# Patient Record
Sex: Female | Born: 1995 | Race: Black or African American | Hispanic: No | Marital: Single | Smoking: Never smoker
Health system: Southern US, Community
[De-identification: ages and names within clinical notes are randomized; demographics above are authoritative.]

## PROBLEM LIST (undated history)

## (undated) DIAGNOSIS — F419 Anxiety disorder, unspecified: Secondary | ICD-10-CM

## (undated) DIAGNOSIS — F329 Major depressive disorder, single episode, unspecified: Secondary | ICD-10-CM

## (undated) DIAGNOSIS — F32A Depression, unspecified: Secondary | ICD-10-CM

## (undated) DIAGNOSIS — Z8719 Personal history of other diseases of the digestive system: Secondary | ICD-10-CM

## (undated) HISTORY — DX: Personal history of other diseases of the digestive system: Z87.19

## (undated) HISTORY — DX: Anxiety disorder, unspecified: F41.9

## (undated) HISTORY — DX: Depression, unspecified: F32.A

## (undated) HISTORY — DX: Major depressive disorder, single episode, unspecified: F32.9

---

## 2016-01-25 ENCOUNTER — Ambulatory Visit: Payer: Self-pay | Admitting: Obstetrics & Gynecology

## 2016-03-21 ENCOUNTER — Other Ambulatory Visit: Payer: Self-pay | Admitting: Nurse Practitioner

## 2016-03-21 DIAGNOSIS — N926 Irregular menstruation, unspecified: Secondary | ICD-10-CM

## 2016-03-28 ENCOUNTER — Ambulatory Visit
Admission: RE | Admit: 2016-03-28 | Discharge: 2016-03-28 | Disposition: A | Discharge status: Home / Self Care | Payer: BLUE CROSS/BLUE SHIELD | Source: Ambulatory Visit | Attending: Nurse Practitioner | Admitting: Nurse Practitioner

## 2016-03-28 ENCOUNTER — Ambulatory Visit
Admission: RE | Admit: 2016-03-28 | Discharge: 2016-03-28 | Disposition: A | Discharge status: Home / Self Care | Payer: Self-pay | Source: Ambulatory Visit | Attending: Nurse Practitioner | Admitting: Nurse Practitioner

## 2016-03-28 DIAGNOSIS — N926 Irregular menstruation, unspecified: Secondary | ICD-10-CM

## 2016-04-01 ENCOUNTER — Other Ambulatory Visit: Payer: Self-pay

## 2017-09-12 ENCOUNTER — Encounter: Payer: Self-pay | Admitting: Gastroenterology

## 2017-10-23 ENCOUNTER — Encounter (INDEPENDENT_AMBULATORY_CARE_PROVIDER_SITE_OTHER): Payer: Self-pay

## 2017-10-23 ENCOUNTER — Encounter: Payer: Self-pay | Admitting: Gastroenterology

## 2017-10-23 ENCOUNTER — Ambulatory Visit: Payer: BLUE CROSS/BLUE SHIELD | Admitting: Gastroenterology

## 2017-10-23 ENCOUNTER — Other Ambulatory Visit (INDEPENDENT_AMBULATORY_CARE_PROVIDER_SITE_OTHER): Payer: BLUE CROSS/BLUE SHIELD

## 2017-10-23 VITALS — BP 128/80 | HR 88 | Ht 63.0 in | Wt 203.8 lb

## 2017-10-23 DIAGNOSIS — R194 Change in bowel habit: Secondary | ICD-10-CM | POA: Diagnosis not present

## 2017-10-23 DIAGNOSIS — R195 Other fecal abnormalities: Secondary | ICD-10-CM

## 2017-10-23 LAB — CBC WITH DIFFERENTIAL/PLATELET
Basophils Absolute: 0 10*3/uL (ref 0.0–0.1)
Basophils Relative: 0.3 % (ref 0.0–3.0)
EOS PCT: 0.3 % (ref 0.0–5.0)
Eosinophils Absolute: 0 10*3/uL (ref 0.0–0.7)
HEMATOCRIT: 38.6 % (ref 36.0–46.0)
Hemoglobin: 13.1 g/dL (ref 12.0–15.0)
LYMPHS ABS: 2.1 10*3/uL (ref 0.7–4.0)
LYMPHS PCT: 51.7 % — AB (ref 12.0–46.0)
MCHC: 34 g/dL (ref 30.0–36.0)
MCV: 85.4 fl (ref 78.0–100.0)
MONOS PCT: 4 % (ref 3.0–12.0)
Monocytes Absolute: 0.2 10*3/uL (ref 0.1–1.0)
Neutro Abs: 1.8 10*3/uL (ref 1.4–7.7)
Neutrophils Relative %: 43.7 % (ref 43.0–77.0)
Platelets: 228 10*3/uL (ref 150.0–400.0)
RBC: 4.52 Mil/uL (ref 3.87–5.11)
RDW: 12.7 % (ref 11.5–15.5)
WBC: 4 10*3/uL (ref 4.0–10.5)

## 2017-10-23 NOTE — Progress Notes (Signed)
Referring Provider: No ref. 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 Will obtain records from prior evaluation. Focus on symptom management. Reviewed FODMAP diet and brochure was provided.   Will need consider IBS masqueraders such as celiac disease, lactose/fructose/gluten intolerance, SIBO, thyroid disorder if her symptoms are not improved with dietary changes or if her prior endoscopic evaluation did not include EGD with duodenal biopsies and colonoscopy with random biopsies.   Will obtain the results from her prior endoscopy regarding blood in the stool. No obvious source identified on rectal exam or anoscopy. Will obtain a hemoglobin to monitor for anemia and sign of microcytosis from iron deficiency.    PLAN: Obtain records from her gastroenterology in [facility] 2015/2016 Obtain xray results from [facility] Trial of Citrucel or Metamucil daily Trial of dicyclomine for symptom control  CBC, H pylori antigen testing Return in 2-3 months   HPI: Krista Savage is a 22 y.o. female seen in consultation at the request of the [facility] for further evaluation of Hemoccult-positive stools. The history is obtained through the patient and review of her referral records.  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  Presented to student health reporting changes in the stool primarily differences in colors, differences in consistency. This has been occurring over several months. Has recently noticed intermittent incomplete evacuation. Associated diffuse abdominal pain and bloating. Weight fluctuates.   She has avoid  dairy and friend foods with some improvement. Moved to a plant-based diet. Known that stress escalates her symptoms and tries to stay relaxed. Tearful when thinking about her symptoms. No other associated symptoms. No identified exacerbating or relieving features.   Hemoccult test positive in the evaluation of her symptoms. She also had abdominal films that were "regular," although those results were not included in her referral records.  Diagnosed as IBS in 3rd grade when she was experiencing abdominal pain. Seen for escalating abdominal pain in [place] in 2015-2016. Had an EGD and a colonoscopy. No prescription medications.   Past Medical History:  Diagnosis Date  . Anxiety   . Depression   . History of IBS     History reviewed. No pertinent surgical history.  Prior to Admission medications   Not on File    Current Outpatient Medications  Medication Sig Dispense Refill  . Ascorbic Acid (VITAMIN C) 1000 MG tablet Take 1,000 mg by mouth daily.     No current facility-administered medications for this visit.     Allergies as of 10/23/2017  . (No Known Allergies)    Family History  Problem Relation Age of Onset  . Ovarian cancer Maternal Grandmother     Social History   Socioeconomic History  . Marital status: Single    Spouse name: Not on file  . Number of children: Not on file  . Years of education: Not on file  . Highest education level: Not on file  Occupational History  . Not on file  Social Needs  . Financial resource strain: Not on file  . Food insecurity:    Worry: Not on file    Inability: Not on file  . Transportation needs:    Medical: Not on  file    Non-medical: Not on file  Tobacco Use  . Smoking status: Never Smoker  . Smokeless tobacco: Never Used  Substance and Sexual Activity  . Alcohol use: Yes    Comment: occasionally  . Drug use: Never  . Sexual activity: Not on file  Lifestyle  . Physical activity:    Days per week: Not on file     Minutes per session: Not on file  . Stress: Not on file  Relationships  . Social connections:    Talks on phone: Not on file    Gets together: Not on file    Attends religious service: Not on file    Active member of club or organization: Not on file    Attends meetings of clubs or organizations: Not on file    Relationship status: Not on file  . Intimate partner violence:    Fear of current or ex partner: Not on file    Emotionally abused: Not on file    Physically abused: Not on file    Forced sexual activity: Not on file  Other Topics Concern  . Not on file  Social History Narrative  . Not on file    Review of Systems: 12 system ROS is negative except as noted above with the addition of allergies, anxiety back pain, depression, fatigue, headaches, menstrual pain.   Physical Exam: 128/80, 88, 98% on room air   General:   Alert, well-nourished, pleasant and cooperative in NAD Head:  Normocephalic and atraumatic. Eyes:  Sclera clear, no icterus.   Conjunctiva pink. Mouth:  No deformity or lesions.   Neck:  Supple; no thyromegaly. Lungs:  Clear throughout to auscultation.   No wheezes.  Heart:  Regular rate and rhythm; no murmurs Abdomen:  Soft, nontender, normal bowel sounds. No rebound or guarding. No hepatosplenomegaly Rectal:  No chemical dermatitis. No prolapsing hemorrhoids. No fissure or fistula. No rectal prolapse. Normal anocutaneous reflex. No stool in the rectal vault. No mass or fecal impaction. Normal anal resting tone.  Anoscopy revealed no mucosal abnormalities. Msk:  Symmetrical without gross deformities. Extremities:  No gross deformities or edema. Neurologic:  Alert and  oriented x4;  grossly nonfocal Skin:  No rash or bruise. Psych:  Alert and cooperative. Normal mood and affect.    Jadd Gasior L. Orvan Falconer Md, MPH [facility] 10/23/2017, 5:23 PM

## 2017-10-23 NOTE — Patient Instructions (Addendum)
Your provider has requested that you go to the basement level for lab work before leaving today. Press "B" on the elevator. The lab is located at the first door on the left as you exit the elevator.  [email or web address]  Try Citrucel  Or Metamucil daily.

## 2017-11-03 ENCOUNTER — Other Ambulatory Visit: Payer: BLUE CROSS/BLUE SHIELD

## 2017-11-03 DIAGNOSIS — R195 Other fecal abnormalities: Secondary | ICD-10-CM

## 2017-11-06 LAB — HELICOBACTER PYLORI  SPECIAL ANTIGEN
MICRO NUMBER: 91317595
SPECIMEN QUALITY: ADEQUATE

## 2017-12-22 ENCOUNTER — Ambulatory Visit: Payer: BLUE CROSS/BLUE SHIELD | Admitting: Gastroenterology

## 2017-12-22 NOTE — Progress Notes (Deleted)
Referring Provider: No ref. 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 female returns in scheduled follow-up after her initial consultation.  I reviewed some records from her prior care in [place].  [facility] [place] [facility]: normal duodenum, acute gastric inflammation, normal esophagus.  No procedure notes were included in the records.   Office visit 03/06/14 with PA Jeraldine Lootsyler Anderson for a dull lower back and lower abdominal pain.  Mild nausea with no vomiting. She was referred to OB/GYN at that time as the patient thought the symptoms were menstrual cramps.     Past Medical History:  Diagnosis Date  . Anxiety   . Depression   . History of IBS     No past surgical history on file.  Current Outpatient Medications  Medication Sig Dispense Refill  . Ascorbic Acid (VITAMIN C) 1000 MG tablet Take 1,000 mg by mouth daily.     No current facility-administered medications for this visit.     Allergies as of 12/22/2017  . (No Known Allergies)    Family History  Problem Relation Age of Onset  . Ovarian cancer Maternal Grandmother     Social History   Socioeconomic History  . Marital status: Single    Spouse name: Not on file  . Number of children: Not on file  . Years of education: Not on file  . Highest education level: Not on file  Occupational History  . Not on file  Social Needs  . Financial resource strain: Not on file  . Food insecurity:    Worry: Not on file    Inability: Not on file  . Transportation needs:    Medical: Not on file    Non-medical: Not on file  Tobacco Use  . Smoking status: Never Smoker  . Smokeless tobacco: Never Used  Substance and Sexual  Activity  . Alcohol use: Yes    Comment: occasionally  . Drug use: Never  . Sexual activity: Not on file  Lifestyle  . Physical activity:    Days per week: Not on file    Minutes per session: Not on file  . Stress: Not on file  Relationships  . Social connections:    Talks on phone: Not on file    Gets together: Not on file    Attends religious service: Not on file    Active member of club or organization: Not on file    Attends meetings of clubs or organizations: Not on file    Relationship status: Not on file  . Intimate partner violence:    Fear of current or ex partner: Not on file    Emotionally abused: Not on file    Physically abused: Not on file    Forced sexual activity: Not on file  Other Topics Concern  . Not on file  Social History Narrative  . Not on file    Review of Systems: 12 system ROS is negative except as noted above.  There were no vitals filed for this visit.  Physical Exam: Vital signs were reviewed. General:   Alert, well-nourished, pleasant and cooperative in NAD Head:  Normocephalic and atraumatic. Eyes:  Sclera clear, no icterus.   Conjunctiva pink.  Mouth:  No deformity or lesions.   Neck:  Supple; no thyromegaly. Lungs:  Clear throughout to auscultation.   No wheezes.  Heart:  Regular rate and rhythm; no murmurs Abdomen:  Soft, nontender, normal bowel sounds. No rebound or guarding. No hepatosplenomegaly Rectal:  Deferred  Msk:  Symmetrical without gross deformities. Extremities:  No gross deformities or edema. Neurologic:  Alert and  oriented x4;  grossly nonfocal Skin:  No rash or bruise. Psych:  Alert and cooperative. Normal mood and affect.   Jaquel Glassburn L. Orvan FalconerBeavers, MD, MPH [facility] 12/22/2017, 7:56 AM

## 2018-01-16 IMAGING — US US TRANSVAGINAL NON-OB
1 series · 14 of 25 positions shown · non-contrast
Comparison: None

CLINICAL DATA: Irregular menses.  Heavy bleeding

EXAM:
TRANSABDOMINAL AND TRANSVAGINAL ULTRASOUND OF PELVIS
TECHNIQUE: Both transabdominal and transvaginal ultrasound examinations of the
pelvis were performed. Transabdominal technique was performed for
global imaging of the pelvis including uterus, ovaries, adnexal
regions, and pelvic cul-de-sac. It was necessary to proceed with
endovaginal exam following the transabdominal exam to visualize the
endometrium and ovaries.

[Series 1: us transvaginal non-ob · 0.23mm/px · 14 of 38 slices shown]
[im 1/38]
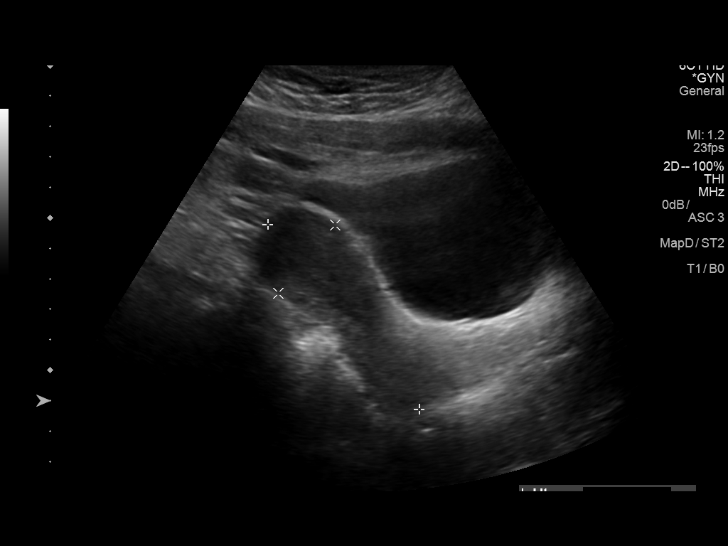
[im 4/38]
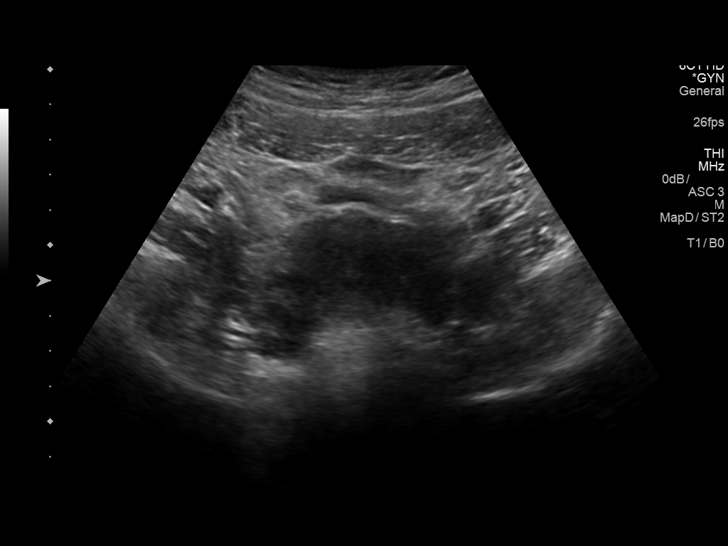
[im 7/38]
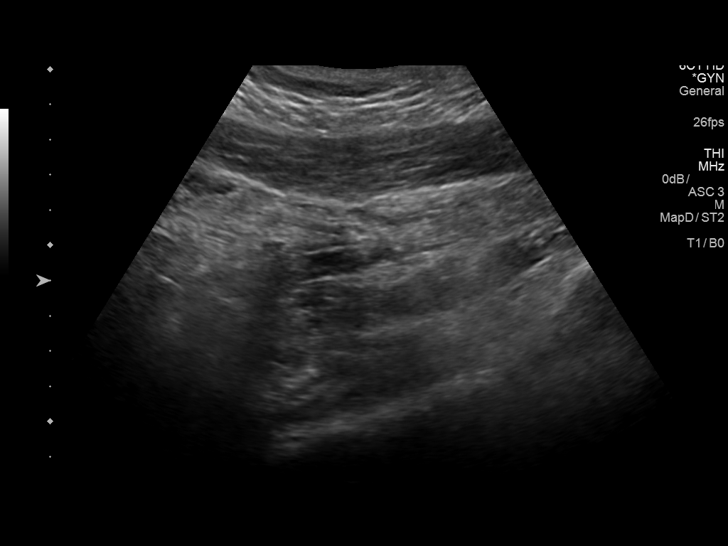
[im 10/38]
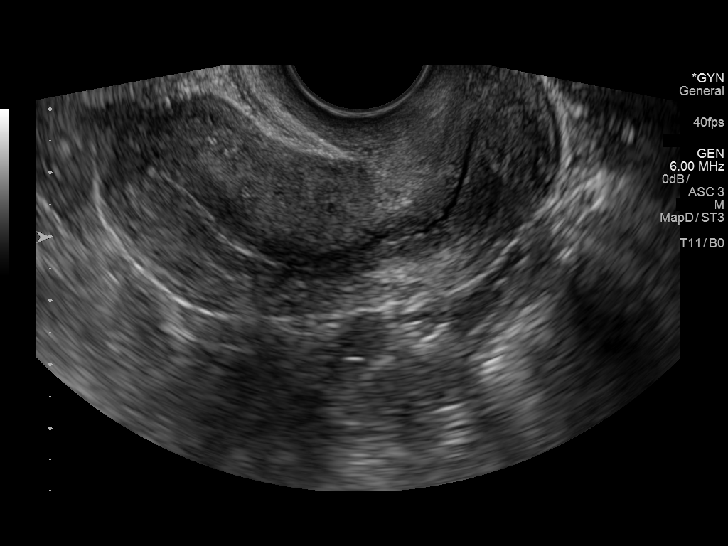
[im 13/38]
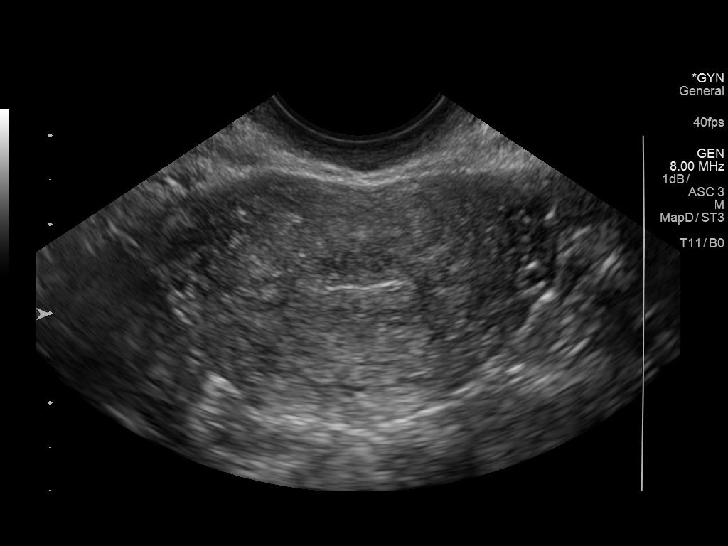
[im 14/38]
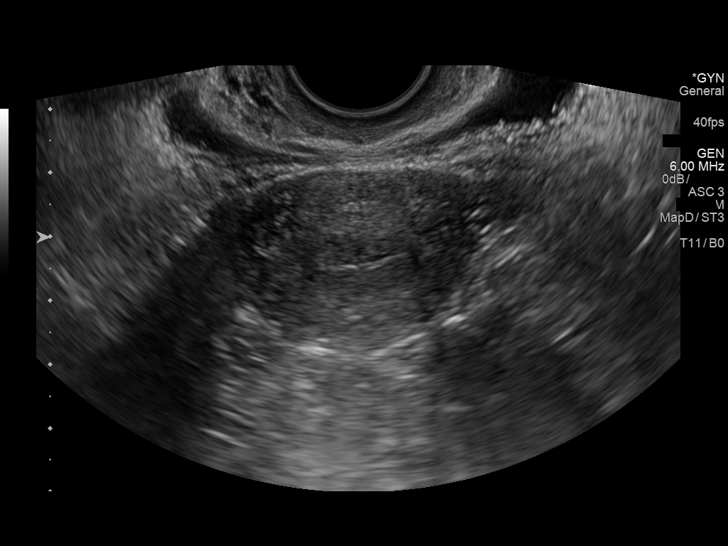
[im 17/38]
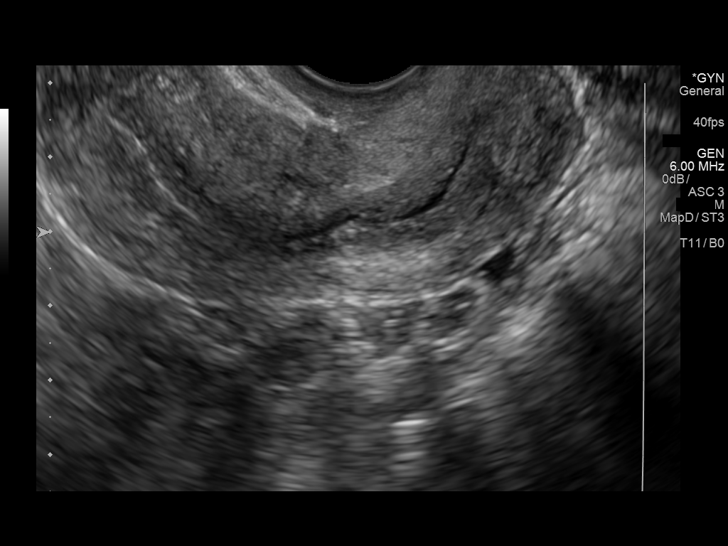
[im 21/38]
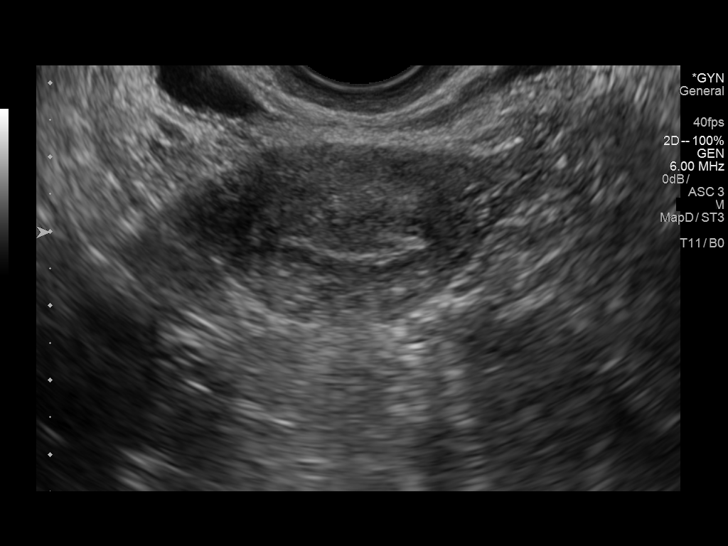
[im 24/38]
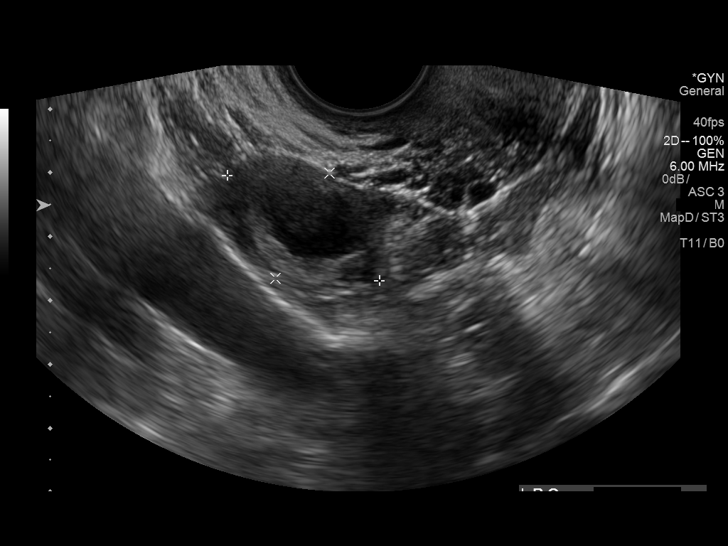
[im 25/38]
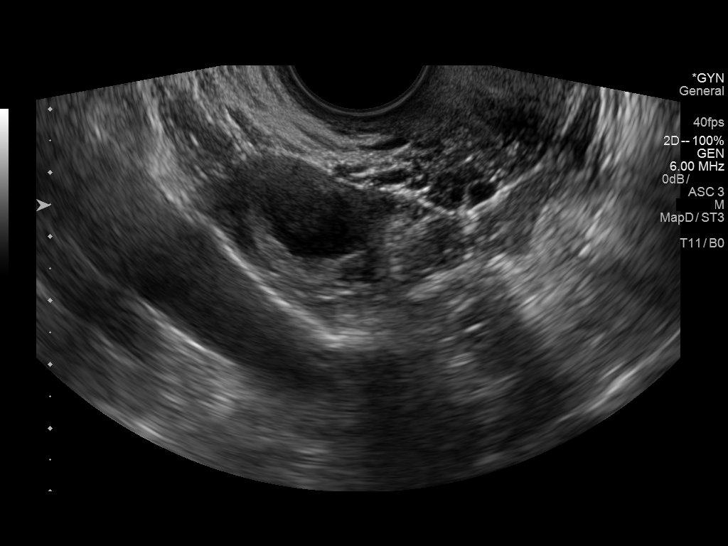
[im 28/38]
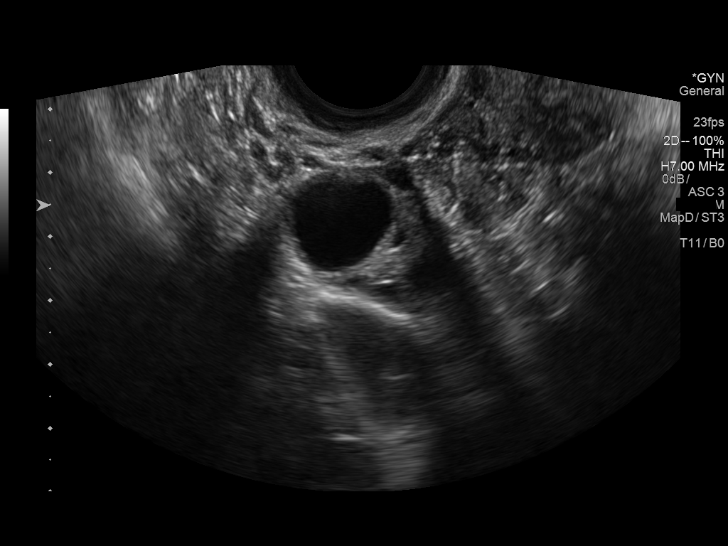
[im 31/38]
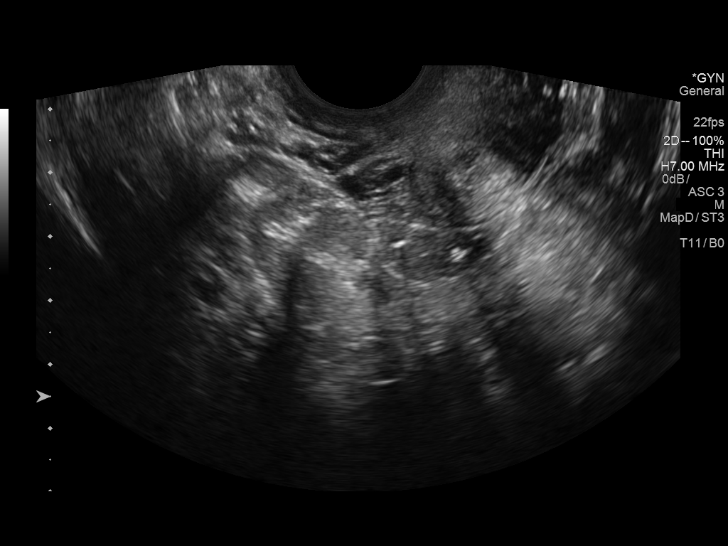
[im 34/38]
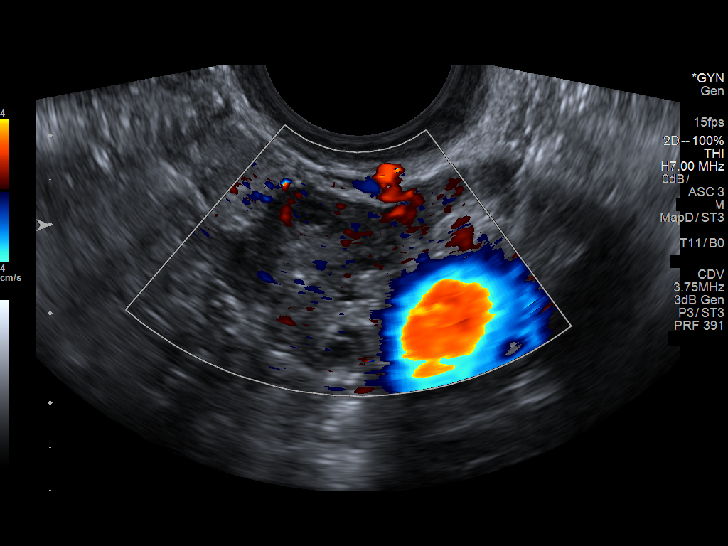
[im 38/38]
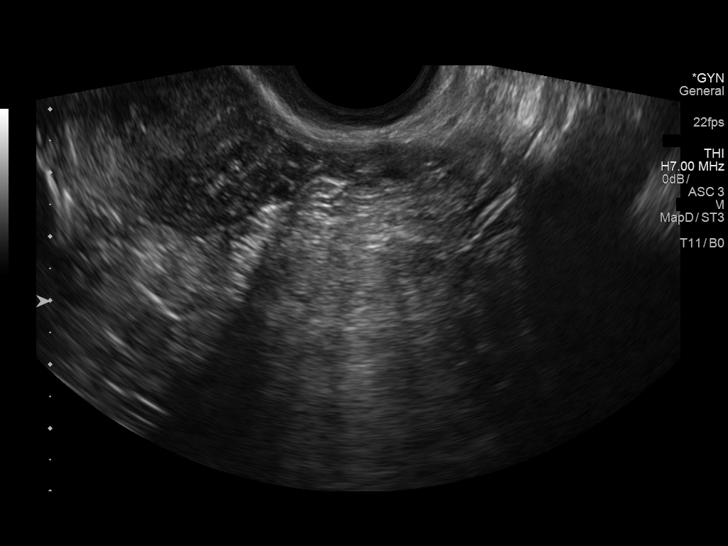

[14 of 25 positions shown; findings below may reference images not displayed]

FINDINGS: Uterus

Measurements: Normal in size at 7.8 x 2.9 x 3.7 cm.. No fibroids or
other mass visualized.

Endometrium

Thickness: Normal thickness at 5 mm. No focal abnormality
visualized.

Right ovary

Measurements: Normal in size with small follicles measuring 2.9 x
1.9 x 2.7 cm.. One dominant follicle.

Left ovary

Measurements: Normal in size at 2.5 x 2.2 x 2.0 cm . Normal small
follicles

Other findings

No abnormal free fluid.
IMPRESSION: Normal uterus and ovaries.

## 2018-07-09 ENCOUNTER — Telehealth: Payer: Self-pay

## 2018-07-09 NOTE — Telephone Encounter (Signed)
Covid-19 screening questions   Do you now or have you had a fever in the last 14 days? No  Do you have any respiratory symptoms of shortness of breath or cough now or in the last 14 days? no  Do you have any family members or close contacts with diagnosed or suspected Covid-19 in the past 14 days? no  Have you been tested for Covid-19 and found to be positive? no      

## 2018-07-10 ENCOUNTER — Ambulatory Visit: Payer: BLUE CROSS/BLUE SHIELD | Admitting: Nurse Practitioner

## 2018-11-21 ENCOUNTER — Other Ambulatory Visit: Payer: Self-pay | Admitting: Nurse Practitioner

## 2018-11-21 ENCOUNTER — Other Ambulatory Visit (HOSPITAL_COMMUNITY)
Admission: RE | Admit: 2018-11-21 | Discharge: 2018-11-21 | Disposition: A | Discharge status: Home / Self Care | Payer: Commercial Managed Care - PPO | Source: Ambulatory Visit | Attending: Nurse Practitioner | Admitting: Nurse Practitioner

## 2018-11-21 DIAGNOSIS — Z124 Encounter for screening for malignant neoplasm of cervix: Secondary | ICD-10-CM | POA: Diagnosis not present

## 2018-12-04 LAB — CYTOLOGY - PAP
Chlamydia: NEGATIVE
Comment: NEGATIVE
Comment: NORMAL
Diagnosis: NEGATIVE
Neisseria Gonorrhea: NEGATIVE
